# Patient Record
Sex: Female | Born: 1985 | Race: Black or African American | Hispanic: No | Marital: Single | Smoking: Never smoker
Health system: Southern US, Community
[De-identification: ages and names within clinical notes are randomized; demographics above are authoritative.]

## PROBLEM LIST (undated history)

## (undated) DIAGNOSIS — I10 Essential (primary) hypertension: Secondary | ICD-10-CM

## (undated) HISTORY — DX: Essential (primary) hypertension: I10

---

## 2017-02-18 ENCOUNTER — Emergency Department: Payer: No Typology Code available for payment source

## 2017-02-18 ENCOUNTER — Emergency Department
Admission: EM | Admit: 2017-02-18 | Discharge: 2017-02-18 | Disposition: A | Discharge status: Home / Self Care | Payer: No Typology Code available for payment source | Attending: Student in an Organized Health Care Education/Training Program | Admitting: Student in an Organized Health Care Education/Training Program

## 2017-02-18 ENCOUNTER — Other Ambulatory Visit: Payer: Self-pay

## 2017-02-18 DIAGNOSIS — Y999 Unspecified external cause status: Secondary | ICD-10-CM | POA: Diagnosis not present

## 2017-02-18 DIAGNOSIS — Y929 Unspecified place or not applicable: Secondary | ICD-10-CM | POA: Insufficient documentation

## 2017-02-18 DIAGNOSIS — S1081XA Abrasion of other specified part of neck, initial encounter: Secondary | ICD-10-CM | POA: Diagnosis not present

## 2017-02-18 DIAGNOSIS — R0789 Other chest pain: Secondary | ICD-10-CM | POA: Diagnosis not present

## 2017-02-18 DIAGNOSIS — Y939 Activity, unspecified: Secondary | ICD-10-CM | POA: Insufficient documentation

## 2017-02-18 DIAGNOSIS — S1091XA Abrasion of unspecified part of neck, initial encounter: Secondary | ICD-10-CM

## 2017-02-18 DIAGNOSIS — S199XXA Unspecified injury of neck, initial encounter: Secondary | ICD-10-CM | POA: Diagnosis present

## 2017-02-18 MED ORDER — IBUPROFEN 800 MG PO TABS
800.0000 mg | ORAL_TABLET | Freq: Once | ORAL | Status: AC
  Administered 2017-02-18: 800 mg via ORAL
  Filled 2017-02-18: qty 1

## 2017-02-18 MED ORDER — IBUPROFEN 600 MG PO TABS: 600.0000 mg | ORAL_TABLET | Freq: Three times a day (TID) | ORAL | 0 refills | Status: DC | PRN

## 2017-02-18 MED ORDER — METHOCARBAMOL 500 MG PO TABS
1000.0000 mg | ORAL_TABLET | Freq: Once | ORAL | Status: AC
  Administered 2017-02-18: 1000 mg via ORAL
  Filled 2017-02-18: qty 2

## 2017-02-18 MED ORDER — METHOCARBAMOL 500 MG PO TABS: ORAL_TABLET | ORAL | 0 refills | Status: DC
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
## 2017-02-18 NOTE — Discharge Instructions (Signed)
Follow-up with your primary care doctor if any continued problems. Watch the abrasion to your  neck and cleaned daily with mild soap and water. See your doctor if any signs of infection. Begin taking ibuprofen 600 mg every 8 hours with food for inflammation and pain. Robaxin 1-2 tablets every 6 hours as needed for muscle spasms. You may use ice or heat to muscles as needed for discomfort. You  can expect to be sore for possibly 4-5 days.
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
## 2018-08-14 ENCOUNTER — Encounter: Payer: Self-pay | Admitting: Obstetrics and Gynecology

## 2018-08-23 ENCOUNTER — Telehealth: Payer: Self-pay | Admitting: Obstetrics and Gynecology

## 2018-08-23 NOTE — Telephone Encounter (Signed)

## 2018-08-27 ENCOUNTER — Other Ambulatory Visit (HOSPITAL_COMMUNITY)
Admission: RE | Admit: 2018-08-27 | Discharge: 2018-08-27 | Disposition: A | Discharge status: Home / Self Care | Payer: BC Managed Care – PPO | Source: Ambulatory Visit | Attending: Obstetrics and Gynecology | Admitting: Obstetrics and Gynecology

## 2018-08-27 ENCOUNTER — Encounter: Payer: Self-pay | Admitting: Obstetrics and Gynecology

## 2018-08-27 ENCOUNTER — Ambulatory Visit (INDEPENDENT_AMBULATORY_CARE_PROVIDER_SITE_OTHER): Payer: BC Managed Care – PPO | Admitting: Obstetrics and Gynecology

## 2018-08-27 ENCOUNTER — Other Ambulatory Visit: Payer: Self-pay

## 2018-08-27 VITALS — BP 158/106 | HR 91 | Ht 67.0 in | Wt 287.2 lb

## 2018-08-27 DIAGNOSIS — A5901 Trichomonal vulvovaginitis: Secondary | ICD-10-CM | POA: Diagnosis not present

## 2018-08-27 DIAGNOSIS — Z01419 Encounter for gynecological examination (general) (routine) without abnormal findings: Secondary | ICD-10-CM | POA: Insufficient documentation

## 2018-08-27 LAB — POCT WET PREP (WET MOUNT): WBC, Wet Prep HPF POC: POSITIVE

## 2018-08-27 MED ORDER — METRONIDAZOLE 500 MG PO TABS: 500.0000 mg | ORAL_TABLET | Freq: Two times a day (BID) | ORAL | 1 refills | Status: AC
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
## 2018-08-27 NOTE — Addendum Note (Signed)
Addended by: Linton Rump on: 08/27/2018 12:18 PM   Modules accepted: Orders

## 2018-08-29 ENCOUNTER — Telehealth: Payer: Self-pay | Admitting: Obstetrics and Gynecology

## 2018-08-29 LAB — CYTOLOGY - PAP
Chlamydia: NEGATIVE
Diagnosis: NEGATIVE
HPV: NOT DETECTED
Neisseria Gonorrhea: NEGATIVE

## 2018-08-29 NOTE — Telephone Encounter (Signed)
Phone call cancelled as pt has tx for trich at time of pap collection

## 2018-09-26 ENCOUNTER — Telehealth: Payer: Self-pay | Admitting: Obstetrics and Gynecology

## 2018-09-26 NOTE — Telephone Encounter (Signed)

## 2018-09-27 ENCOUNTER — Ambulatory Visit: Payer: BC Managed Care – PPO | Admitting: Obstetrics and Gynecology
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
## 2021-08-09 DIAGNOSIS — I169 Hypertensive crisis, unspecified: Secondary | ICD-10-CM | POA: Diagnosis not present

## 2021-08-09 DIAGNOSIS — Z6841 Body Mass Index (BMI) 40.0 and over, adult: Secondary | ICD-10-CM | POA: Diagnosis not present

## 2021-08-09 DIAGNOSIS — I10 Essential (primary) hypertension: Secondary | ICD-10-CM | POA: Diagnosis not present

## 2021-08-25 DIAGNOSIS — I10 Essential (primary) hypertension: Secondary | ICD-10-CM | POA: Diagnosis not present

## 2021-08-25 DIAGNOSIS — I169 Hypertensive crisis, unspecified: Secondary | ICD-10-CM | POA: Diagnosis not present

## 2021-08-25 DIAGNOSIS — Z79899 Other long term (current) drug therapy: Secondary | ICD-10-CM | POA: Diagnosis not present

## 2021-08-25 DIAGNOSIS — Z6841 Body Mass Index (BMI) 40.0 and over, adult: Secondary | ICD-10-CM | POA: Diagnosis not present

## 2021-12-05 ENCOUNTER — Ambulatory Visit: Payer: BC Managed Care – PPO | Admitting: Obstetrics & Gynecology

## 2022-01-17 ENCOUNTER — Encounter: Payer: Self-pay | Admitting: Obstetrics & Gynecology

## 2022-01-17 ENCOUNTER — Other Ambulatory Visit (HOSPITAL_COMMUNITY)
Admission: RE | Admit: 2022-01-17 | Discharge: 2022-01-17 | Disposition: A | Discharge status: Home / Self Care | Payer: BC Managed Care – PPO | Source: Ambulatory Visit | Attending: Obstetrics & Gynecology | Admitting: Obstetrics & Gynecology

## 2022-01-17 ENCOUNTER — Ambulatory Visit (INDEPENDENT_AMBULATORY_CARE_PROVIDER_SITE_OTHER): Payer: BC Managed Care – PPO | Admitting: Obstetrics & Gynecology

## 2022-01-17 VITALS — BP 179/115 | HR 93 | Ht 67.0 in | Wt 311.2 lb

## 2022-01-17 DIAGNOSIS — Z01419 Encounter for gynecological examination (general) (routine) without abnormal findings: Secondary | ICD-10-CM

## 2022-01-17 NOTE — Progress Notes (Signed)
Subjective:     Sherri Griffin is a 36 y.o. female here for a routine exam.  Patient's last menstrual period was 12/05/2021. G1P1001 Birth Control Method:  mirena IUD Menstrual Calendar(currently): irregular  Current complaints: none.   Current acute medical issues:  none   Recent Gynecologic History Patient's last menstrual period was 12/05/2021. Last Pap: 2020,  normal Last mammogram: na,    Past Medical History:  Diagnosis Date   Hypertension     History reviewed. No pertinent surgical history.  OB History     Gravida  1   Para  1   Term  1   Preterm      AB      Living  1      SAB      IAB      Ectopic      Multiple      Live Births  1           Social History   Socioeconomic History   Marital status: Single    Spouse name: Not on file   Number of children: Not on file   Years of education: Not on file   Highest education level: Not on file  Occupational History   Not on file  Tobacco Use   Smoking status: Never   Smokeless tobacco: Never  Vaping Use   Vaping Use: Never used  Substance and Sexual Activity   Alcohol use: Yes    Comment: wine daily   Drug use: Never   Sexual activity: Yes    Birth control/protection: I.U.D.  Other Topics Concern   Not on file  Social History Narrative   Not on file   Social Determinants of Health   Financial Resource Strain: Medium Risk (01/17/2022)   Overall Financial Resource Strain (CARDIA)    Difficulty of Paying Living Expenses: Somewhat hard  Food Insecurity: Food Insecurity Present (01/17/2022)   Hunger Vital Sign    Worried About Running Out of Food in the Last Year: Sometimes true    Ran Out of Food in the Last Year: Sometimes true  Transportation Needs: No Transportation Needs (01/17/2022)   PRAPARE - Administrator, Civil Service (Medical): No    Lack of Transportation (Non-Medical): No  Physical Activity: Insufficiently Active (01/17/2022)   Exercise Vital Sign    Days  of Exercise per Week: 2 days    Minutes of Exercise per Session: 10 min  Stress: No Stress Concern Present (01/17/2022)   [employer] of Occupational Health - Occupational Stress Questionnaire    Feeling of Stress : Only a little  Social Connections: Moderately Integrated (01/17/2022)   Social Connection and Isolation Panel [NHANES]    Frequency of Communication with Friends and Family: More than three times a week    Frequency of Social Gatherings with Friends and Family: More than three times a week    Attends Religious Services: 1 to 4 times per year    Active Member of [employer] or Organizations: Yes    Attends Banker Meetings: 1 to 4 times per year    Marital Status: Never married    Family History  Problem Relation Age of Onset   Diabetes Paternal Grandfather    Hypertension Paternal Grandmother    High Cholesterol Paternal Grandmother    Hypertension Maternal Grandmother    High Cholesterol Maternal Grandmother    Hypertension Maternal Grandfather    High Cholesterol Maternal Grandfather    Hypertension Father  High Cholesterol Father    Hypertension Mother    High Cholesterol Mother      Current Outpatient Medications:    amLODipine (NORVASC) 10 MG tablet, Take 10 mg by mouth daily. , Disp: , Rfl:    hydrochlorothiazide (HYDRODIURIL) 25 MG tablet, Take 25 mg by mouth daily. , Disp: , Rfl:    ibuprofen (ADVIL,MOTRIN) 600 MG tablet, Take 1 tablet (600 mg total) by mouth every 8 (eight) hours as needed., Disp: 30 tablet, Rfl: 0   levonorgestrel (LILETTA, 52 MG,) 19.5 MCG/DAY IUD IUD, 1 each by Intrauterine route once., Disp: , Rfl:    clindamycin (CLEOCIN) 300 MG capsule, 300 mg 3 (three) times daily.  (Patient not taking: Reported on 01/17/2022), Disp: , Rfl:   Review of Systems  Review of Systems  Constitutional: Negative for fever, chills, weight loss, malaise/fatigue and diaphoresis.  HENT: Negative for hearing loss, ear pain, nosebleeds,  congestion, sore throat, neck pain, tinnitus and ear discharge.   Eyes: Negative for blurred vision, double vision, photophobia, pain, discharge and redness.  Respiratory: Negative for cough, hemoptysis, sputum production, shortness of breath, wheezing and stridor.   Cardiovascular: Negative for chest pain, palpitations, orthopnea, claudication, leg swelling and PND.  Gastrointestinal: negative for abdominal pain. Negative for heartburn, nausea, vomiting, diarrhea, constipation, blood in stool and melena.  Genitourinary: Negative for dysuria, urgency, frequency, hematuria and flank pain.  Musculoskeletal: Negative for myalgias, back pain, joint pain and falls.  Skin: Negative for itching and rash.  Neurological: Negative for dizziness, tingling, tremors, sensory change, speech change, focal weakness, seizures, loss of consciousness, weakness and headaches.  Endo/Heme/Allergies: Negative for environmental allergies and polydipsia. Does not bruise/bleed easily.  Psychiatric/Behavioral: Negative for depression, suicidal ideas, hallucinations, memory loss and substance abuse. The patient is not nervous/anxious and does not have insomnia.        Objective:  Height 5\' 7"  (1.702 m), weight (!) 311 lb 3.2 oz (141.2 kg), last menstrual period 12/05/2021.   Physical Exam  Vitals reviewed. Constitutional: She is oriented to person, place, and time. She appears well-developed and well-nourished.  HENT:  Head: Normocephalic and atraumatic.        Right Ear: External ear normal.  Left Ear: External ear normal.  Nose: Nose normal.  Mouth/Throat: Oropharynx is clear and moist.  Eyes: Conjunctivae and EOM are normal. Pupils are equal, round, and reactive to light. Right eye exhibits no discharge. Left eye exhibits no discharge. No scleral icterus.  Neck: Normal range of motion. Neck supple. No tracheal deviation present. No thyromegaly present.  Cardiovascular: Normal rate, regular rhythm, normal heart  sounds and intact distal pulses.  Exam reveals no gallop and no friction rub.   No murmur heard. Respiratory: Effort normal and breath sounds normal. No respiratory distress. She has no wheezes. She has no rales. She exhibits no tenderness.  GI: Soft. Bowel sounds are normal. She exhibits no distension and no mass. There is no tenderness. There is no rebound and no guarding.  Genitourinary:  Breasts no masses skin changes or nipple changes bilaterally      Vulva is normal without lesions Vagina is pink moist without discharge Cervix normal in appearance and pap is done, strings visible Uterus is normal size shape and contour Adnexa is negative with normal sized ovaries   Musculoskeletal: Normal range of motion. She exhibits no edema and no tenderness.  Neurological: She is alert and oriented to person, place, and time. She has normal reflexes. She displays normal reflexes. No cranial  nerve deficit. She exhibits normal muscle tone. Coordination normal.  Skin: Skin is warm and dry. No rash noted. No erythema. No pallor.  Psychiatric: She has a normal mood and affect. Her behavior is normal. Judgment and thought content normal.       Medications Ordered at today's visit: No orders of the defined types were placed in this encounter.   Other orders placed at today's visit: No orders of the defined types were placed in this encounter.     Assessment:    Normal Gyn exam.    Plan:   Contraception IUD Repeat yearly 3 years     Return in about 3 years (around 01/17/2025) for yearly.

## 2022-01-23 LAB — CYTOLOGY - PAP
Comment: NEGATIVE
Diagnosis: UNDETERMINED — AB
High risk HPV: NEGATIVE

## 2022-04-05 DIAGNOSIS — I10 Essential (primary) hypertension: Secondary | ICD-10-CM | POA: Diagnosis not present

## 2022-04-05 DIAGNOSIS — F4323 Adjustment disorder with mixed anxiety and depressed mood: Secondary | ICD-10-CM | POA: Diagnosis not present

## 2022-10-19 DIAGNOSIS — Z713 Dietary counseling and surveillance: Secondary | ICD-10-CM | POA: Diagnosis not present

## 2022-10-19 DIAGNOSIS — Z6841 Body Mass Index (BMI) 40.0 and over, adult: Secondary | ICD-10-CM | POA: Diagnosis not present

## 2022-10-19 DIAGNOSIS — I169 Hypertensive crisis, unspecified: Secondary | ICD-10-CM | POA: Diagnosis not present

## 2022-10-19 DIAGNOSIS — Z91199 Patient's noncompliance with other medical treatment and regimen due to unspecified reason: Secondary | ICD-10-CM | POA: Diagnosis not present

## 2022-10-19 DIAGNOSIS — I10 Essential (primary) hypertension: Secondary | ICD-10-CM | POA: Diagnosis not present

## 2022-10-19 DIAGNOSIS — Z7182 Exercise counseling: Secondary | ICD-10-CM | POA: Diagnosis not present

## 2022-10-19 DIAGNOSIS — Z79899 Other long term (current) drug therapy: Secondary | ICD-10-CM | POA: Diagnosis not present

## 2022-11-07 DIAGNOSIS — Z713 Dietary counseling and surveillance: Secondary | ICD-10-CM | POA: Diagnosis not present

## 2022-11-07 DIAGNOSIS — I10 Essential (primary) hypertension: Secondary | ICD-10-CM | POA: Diagnosis not present

## 2022-11-15 DIAGNOSIS — N6320 Unspecified lump in the left breast, unspecified quadrant: Secondary | ICD-10-CM | POA: Diagnosis not present

## 2022-11-15 DIAGNOSIS — R92323 Mammographic fibroglandular density, bilateral breasts: Secondary | ICD-10-CM | POA: Diagnosis not present

## 2022-12-05 ENCOUNTER — Encounter: Payer: Self-pay | Admitting: Internal Medicine

## 2022-12-05 ENCOUNTER — Telehealth: Payer: Self-pay

## 2022-12-05 ENCOUNTER — Telehealth: Payer: Self-pay | Admitting: Internal Medicine

## 2022-12-05 ENCOUNTER — Ambulatory Visit: Payer: BC Managed Care – PPO | Attending: Internal Medicine | Admitting: Internal Medicine

## 2022-12-05 VITALS — BP 160/100 | HR 92 | Ht 66.0 in | Wt 303.0 lb

## 2022-12-05 DIAGNOSIS — G4733 Obstructive sleep apnea (adult) (pediatric): Secondary | ICD-10-CM

## 2022-12-05 DIAGNOSIS — R634 Abnormal weight loss: Secondary | ICD-10-CM

## 2022-12-05 DIAGNOSIS — I10 Essential (primary) hypertension: Secondary | ICD-10-CM | POA: Diagnosis not present

## 2022-12-05 DIAGNOSIS — I1A0 Resistant hypertension: Secondary | ICD-10-CM

## 2022-12-05 DIAGNOSIS — Z79899 Other long term (current) drug therapy: Secondary | ICD-10-CM

## 2022-12-05 MED ORDER — CARVEDILOL 6.25 MG PO TABS: 6.2500 mg | ORAL_TABLET | Freq: Two times a day (BID) | ORAL | 2 refills | Status: DC

## 2022-12-05 MED ORDER — ENTRESTO 49-51 MG PO TABS: 1.0000 | ORAL_TABLET | Freq: Two times a day (BID) | ORAL | 2 refills | Status: DC

## 2022-12-05 NOTE — Telephone Encounter (Signed)
Patient needs to have Pre-cert for sleep study

## 2022-12-05 NOTE — Patient Instructions (Addendum)
Medication Instructions:  Your physician has recommended you make the following change in your medication:  Stop taking Lisinopril  Start taking Coreg 6.25 twice daily Start taking Entresto 49-51 twice daily starting on Friday Continue taking all other medications as prescribed  Labwork: BMET in a week at [facility] on 12/12/2022  Testing/Procedures: Your physician has requested that you have an echocardiogram. Echocardiography is a painless test that uses sound waves to create images of your heart. It provides your doctor with information about the size and shape of your heart and how well your heart's chambers and valves are working. This procedure takes approximately one hour. There are no restrictions for this procedure. Please do NOT wear cologne, perfume, aftershave, or lotions (deodorant is allowed). Please arrive 15 minutes prior to your appointment time.  Your physician has requested that you have a renal artery duplex. During this test, an ultrasound is used to evaluate blood flow to the kidneys. Allow one hour for this exam. Do not eat after midnight the day before and avoid carbonated beverages. Take your medications as you usually do.  Your physician has recommended that you have a sleep study. This test records several body functions during sleep, including: brain activity, eye movement, oxygen and carbon dioxide blood levels, heart rate and rhythm, breathing rate and rhythm, the flow of air through your mouth and nose, snoring, body muscle movements, and chest and belly movement.   Follow-Up: Your physician recommends that you schedule a follow-up appointment in: 1 month follow up Telephone visit  Any Other Special Instructions Will Be Listed Below (If Applicable).   If you need a refill on your cardiac medications before your next appointment, please call your pharmacy.

## 2022-12-05 NOTE — Telephone Encounter (Signed)
  Patient Consent for Virtual Visit        Sherri Griffin has provided verbal consent on 12/05/2022 for a virtual visit (video or telephone).   CONSENT FOR VIRTUAL VISIT FOR:  Sherri Griffin  By participating in this virtual visit I agree to the following:  I hereby voluntarily request, consent and authorize [facility] and its employed or contracted physicians, physician assistants, nurse practitioners or other licensed health care professionals (the Practitioner), to provide me with telemedicine health care services (the "Services") as deemed necessary by the treating Practitioner. I acknowledge and consent to receive the Services by the Practitioner via telemedicine. I understand that the telemedicine visit will involve communicating with the Practitioner through live audiovisual communication technology and the disclosure of certain medical information by electronic transmission. I acknowledge that I have been given the opportunity to request an in-person assessment or other available alternative prior to the telemedicine visit and am voluntarily participating in the telemedicine visit.  I understand that I have the right to withhold or withdraw my consent to the use of telemedicine in the course of my care at any time, without affecting my right to future care or treatment, and that the Practitioner or I may terminate the telemedicine visit at any time. I understand that I have the right to inspect all information obtained and/or recorded in the course of the telemedicine visit and may receive copies of available information for a reasonable fee.  I understand that some of the potential risks of receiving the Services via telemedicine include:  Delay or interruption in medical evaluation due to technological equipment failure or disruption; Information transmitted may not be sufficient (e.g. poor resolution of images) to allow for appropriate medical decision making by the Practitioner;  and/or  In rare instances, security protocols could fail, causing a breach of personal health information.  Furthermore, I acknowledge that it is my responsibility to provide information about my medical history, conditions and care that is complete and accurate to the best of my ability. I acknowledge that Practitioner's advice, recommendations, and/or decision may be based on factors not within their control, such as incomplete or inaccurate data provided by me or distortions of diagnostic images or specimens that may result from electronic transmissions. I understand that the practice of medicine is not an exact science and that Practitioner makes no warranties or guarantees regarding treatment outcomes. I acknowledge that a copy of this consent can be made available to me via my patient portal [facility] MyChart), or I can request a printed copy by calling the office of [facility].    I understand that my insurance will be billed for this visit.   I have read or had this consent read to me. I understand the contents of this consent, which adequately explains the benefits and risks of the Services being provided via telemedicine.  I have been provided ample opportunity to ask questions regarding this consent and the Services and have had my questions answered to my satisfaction. I give my informed consent for the services to be provided through the use of telemedicine in my medical care

## 2022-12-05 NOTE — Progress Notes (Signed)
Cardiology Office Note  Date: 12/05/2022   ID: Sherri Griffin, DOB December 17, 1985, MRN [id number]  PCP:  Wilmon Pali, FNP  Cardiologist:  Marjo Bicker, MD Electrophysiologist:  None   History of Present Illness: Sherri Griffin is a 37 y.o. female known to have poorly controlled HTN was referred to cardiology clinic for management of HTN.  Patient's blood pressures have been chronically elevated since delivery of her care which was 10 years ago.  She is currently on 3 antihypertensives with inadequate control of BPs.  Home BPs range between 150 and 170 mmHg SBP.  Compliant with medications and has no side effects.  She was never tested for OSA but was told she snores a lot.  She does have DOE because she thinks she is obese.  No angina, dizziness, presyncope, syncope, palpitations.  No orthopnea, PND.  No leg swelling.  Past Medical History:  Diagnosis Date   Hypertension     No past surgical history on file.  Current Outpatient Medications  Medication Sig Dispense Refill   amLODipine (NORVASC) 10 MG tablet Take 10 mg by mouth daily.      carvedilol (COREG) 6.25 MG tablet Take 1 tablet (6.25 mg total) by mouth 2 (two) times daily with a meal. 60 tablet 2   hydrochlorothiazide (HYDRODIURIL) 25 MG tablet Take 25 mg by mouth daily.      levonorgestrel (LILETTA, 52 MG,) 19.5 MCG/DAY IUD IUD 1 each by Intrauterine route once.     sacubitril-valsartan (ENTRESTO) 49-51 MG Take 1 tablet by mouth 2 (two) times daily. 60 tablet 2   No current facility-administered medications for this visit.   Allergies:  Hydrocodone   Social History: The patient  reports that she has never smoked. She has never used smokeless tobacco. She reports current alcohol use. She reports that she does not use drugs.   Family History: The patient's family history includes Diabetes in her paternal grandfather; High Cholesterol in her father, maternal grandfather, maternal grandmother, mother, and paternal  grandmother; Hypertension in her father, maternal grandfather, maternal grandmother, mother, and paternal grandmother.   ROS:  Please see the history of present illness. Otherwise, complete review of systems is positive for none  All other systems are reviewed and negative.   Physical Exam: VS:  BP (!) 160/100 (BP Location: Right Arm, Cuff Size: Large)   Pulse 92   Ht 5\' 6"  (1.676 m)   Wt (!) 303 lb (137.4 kg)   SpO2 97%   BMI 48.91 kg/m , BMI Body mass index is 48.91 kg/m.  Wt Readings from Last 3 Encounters:  12/05/22 (!) 303 lb (137.4 kg)  01/17/22 (!) 311 lb 3.2 oz (141.2 kg)  08/27/18 287 lb 3.2 oz (130.3 kg)    General: Patient appears comfortable at rest. HEENT: Conjunctiva and lids normal, oropharynx clear with moist mucosa. Neck: Supple, no elevated JVP or carotid bruits, no thyromegaly. Lungs: Clear to auscultation, nonlabored breathing at rest. Cardiac: Regular rate and rhythm, no S3 or significant systolic murmur, no pericardial rub. Abdomen: Soft, nontender, no hepatomegaly, bowel sounds present, no guarding or rebound. Extremities: No pitting edema, distal pulses 2+. Skin: Warm and dry. Musculoskeletal: No kyphosis. Neuropsychiatric: Alert and oriented x3, affect grossly appropriate.  Recent Labwork: No results found for requested labs within last 365 days.  No results found for: "CHOL", "TRIG", "HDL", "CHOLHDL", "VLDL", "LDLCALC", "LDLDIRECT"   Assessment and Plan:  Resistant HTN Morbid obesity with BMI 48.91   -Continue HCTZ 25 mg once  daily, amlodipine 10 mg once daily may, switch lisinopril to Entresto 49-51 mg twice daily (instructed 48-hour washout Of lisinopril prior to starting Entresto), start carvedilol 6.25 mg twice daily.  Obtain BMP in 5 days.  Obtain home sleep study for OSA evaluation, ultrasound renal artery Doppler to rule out renal artery stenosis and to do echocardiogram.  Diet and exercise counseling provided.  Referral to weight loss  program.       Medication Adjustments/Labs and Tests Ordered: Current medicines are reviewed at length with the patient today.  Concerns regarding medicines are outlined above.    Disposition:  Follow up  1 month via telephone visit  Signed Summerlyn Fickel Verne Spurr, MD, 12/05/2022 4:37 PM    [facility] at [facility] [address] [place], [place], [place] [address]

## 2022-12-12 NOTE — Telephone Encounter (Signed)
Prior Authorization for [facility] sent to [employer] via web portal. Tracking Number . READY- NO PA REQ FOR HST   Ordering provider: [name] Associated diagnoses: G47.33 WatchPAT PA obtained on 12/12/2022 by Latrelle Dodrill, CMA. Authorization: No; tracking ID   Patient notified of PIN (1234) on 12/12/2022 via Notification Method: phone.

## 2022-12-12 NOTE — Addendum Note (Signed)
Addended by: Reesa Chew on: 12/12/2022 06:10 PM   Modules accepted: Orders

## 2022-12-13 NOTE — Telephone Encounter (Signed)
Spoke with patient stated that she will be completing sleep study tonight. Patient aware of pin and instructions

## 2022-12-17 ENCOUNTER — Encounter: Payer: Self-pay | Admitting: Cardiology

## 2022-12-18 ENCOUNTER — Ambulatory Visit (HOSPITAL_COMMUNITY)
Admission: RE | Admit: 2022-12-18 | Discharge: 2022-12-18 | Disposition: A | Discharge status: Home / Self Care | Payer: BC Managed Care – PPO | Source: Ambulatory Visit | Attending: Cardiology | Admitting: Cardiology

## 2022-12-18 DIAGNOSIS — I10 Essential (primary) hypertension: Secondary | ICD-10-CM | POA: Diagnosis not present

## 2022-12-19 ENCOUNTER — Ambulatory Visit: Payer: BC Managed Care – PPO | Attending: Internal Medicine

## 2022-12-19 DIAGNOSIS — I10 Essential (primary) hypertension: Secondary | ICD-10-CM

## 2022-12-19 LAB — ECHOCARDIOGRAM COMPLETE
AR max vel: 4.16 cm2
AV Area VTI: 4.25 cm2
AV Area mean vel: 4.81 cm2
AV Mean grad: 4 mm[Hg]
AV Peak grad: 8.4 mm[Hg]
Ao pk vel: 1.45 m/s
Area-P 1/2: 2.73 cm2
Calc EF: 60.1 %
MV VTI: 5.6 cm2
S' Lateral: 2.5 cm
Single Plane A2C EF: 68.2 %
Single Plane A4C EF: 52.1 %

## 2022-12-22 ENCOUNTER — Encounter: Payer: Self-pay | Admitting: *Deleted

## 2022-12-31 ENCOUNTER — Ambulatory Visit: Payer: BC Managed Care – PPO | Attending: Internal Medicine

## 2022-12-31 DIAGNOSIS — I1A0 Resistant hypertension: Secondary | ICD-10-CM

## 2022-12-31 DIAGNOSIS — G4733 Obstructive sleep apnea (adult) (pediatric): Secondary | ICD-10-CM

## 2022-12-31 NOTE — Progress Notes (Unsigned)
This encounter was created in error - please disregard.

## 2022-12-31 NOTE — Procedures (Unsigned)
Erroneous encounter

## 2023-01-16 ENCOUNTER — Ambulatory Visit: Payer: BC Managed Care – PPO | Admitting: Nutrition

## 2023-01-19 ENCOUNTER — Ambulatory Visit: Payer: BC Managed Care – PPO | Attending: Internal Medicine | Admitting: Internal Medicine

## 2023-01-19 ENCOUNTER — Encounter: Payer: Self-pay | Admitting: Internal Medicine

## 2023-01-19 DIAGNOSIS — I10 Essential (primary) hypertension: Secondary | ICD-10-CM

## 2023-01-19 MED ORDER — CARVEDILOL 12.5 MG PO TABS: 12.5000 mg | ORAL_TABLET | Freq: Two times a day (BID) | ORAL | 2 refills | Status: DC

## 2023-01-19 NOTE — Progress Notes (Signed)
Virtual Visit via Telephone Note   Because of Sherri Griffin's co-morbid illnesses, she is at least at moderate risk for complications without adequate follow up.  This format is felt to be most appropriate for this patient at this time.  The patient did not have access to video technology/had technical difficulties with video requiring transitioning to audio format only (telephone).  All issues noted in this document were discussed and addressed.  No physical exam could be performed with this format.  Please refer to the patient's chart for her consent to telehealth for [facility].    Date:  01/19/2023   ID:  Sherri Griffin, DOB Jul 13, 1985, MRN [id number] The patient was identified using 2 identifiers.  Patient Location: Home Provider Location: Office/Clinic   PCP:  Wilmon Pali, FNP   [facility] Providers Cardiologist:  Marjo Bicker, MD     Evaluation Performed:  Follow-Up Visit  Chief Complaint:  HTN  History of Present Illness:    Sherri Griffin is a 37 y.o. female with HTN is here for for a telephone visit.  Previously her BPs were ranging between 150 and 170 mmHg SBP, after medication changes in the last clinic visit, her BPs are better controlled now ranging between 135 and 140 mmHg SBP.  She reported having difficulty with Sherryll Burger, voucher provided.  Does not have any symptoms of angina, DOE, dizziness, syncope, palpitations or leg swelling.   Past Medical History:  Diagnosis Date   Hypertension    No past surgical history on file.   Current Meds  Medication Sig   amLODipine (NORVASC) 10 MG tablet Take 10 mg by mouth daily.    carvedilol (COREG) 12.5 MG tablet Take 1 tablet (12.5 mg total) by mouth 2 (two) times daily.   hydrochlorothiazide (HYDRODIURIL) 25 MG tablet Take 25 mg by mouth daily.    levonorgestrel (LILETTA, 52 MG,) 19.5 MCG/DAY IUD IUD 1 each by Intrauterine route once.   sacubitril-valsartan (ENTRESTO) 49-51 MG Take 1  tablet by mouth 2 (two) times daily.   [DISCONTINUED] carvedilol (COREG) 6.25 MG tablet Take 1 tablet (6.25 mg total) by mouth 2 (two) times daily with a meal.     Allergies:   Hydrocodone   Social History   Tobacco Use   Smoking status: Never   Smokeless tobacco: Never  Vaping Use   Vaping status: Never Used  Substance Use Topics   Alcohol use: Yes    Comment: wine daily   Drug use: Never     Family Hx: The patient's family history includes Diabetes in her paternal grandfather; High Cholesterol in her father, maternal grandfather, maternal grandmother, mother, and paternal grandmother; Hypertension in her father, maternal grandfather, maternal grandmother, mother, and paternal grandmother.  ROS:   Please see the history of present illness.     All other systems reviewed and are negative.   Prior CV studies:   The following studies were reviewed today:    Labs/Other Tests and Data Reviewed:    EKG:    Recent Labs: No results found for requested labs within last 365 days.   Recent Lipid Panel No results found for: "CHOL", "TRIG", "HDL", "CHOLHDL", "LDLCALC", "LDLDIRECT"  Wt Readings from Last 3 Encounters:  01/19/23 290 lb (131.5 kg)  12/05/22 (!) 303 lb (137.4 kg)  01/17/22 (!) 311 lb 3.2 oz (141.2 kg)     Risk Assessment/Calculations:     STOP-Bang Score:  4      Objective:    Vital  Signs:  BP (!) 147/80 Comment: home BP 01/16/2023  Ht 5\' 6"  (1.676 m)   Wt 290 lb (131.5 kg)   BMI 46.81 kg/m      ASSESSMENT & PLAN:    HTN -Continue current antihypertensives, HCTZ 25 mg once daily, amlodipine 10 mg once daily, increase carvedilol from 6.25 to 12.5 mg twice daily, Entresto 49-51 mg twice daily.  Home BPs reviewed, ranging between 135 and 140 mmHg SBP.  Continue heart healthy diet and exercise.  Home sleep study test pending read. Ultrasound renal artery Doppler showed no evidence of renal artery stenosis.   Time:   Today, I have spent 10 minutes  with the patient with telehealth technology discussing the above problems.     Medication Adjustments/Labs and Tests Ordered: Current medicines are reviewed at length with the patient today.  Concerns regarding medicines are outlined above.   Tests Ordered: No orders of the defined types were placed in this encounter.   Medication Changes: Meds ordered this encounter  Medications   carvedilol (COREG) 12.5 MG tablet    Sig: Take 1 tablet (12.5 mg total) by mouth 2 (two) times daily.    Dispense:  180 tablet    Refill:  2    01/19/2023 dose increase    Follow Up:   F/u   6 months  Signed, Jenalee Trevizo P Essence Merle, MD  01/19/2023 2:54 PM    [facility]

## 2023-01-19 NOTE — Patient Instructions (Addendum)
Medication Instructions:  Your physician has recommended you make the following change in your medication:  Increase carvedilol to 12.5 mg twice daily Continue all other medications the same  Labwork: none  Testing/Procedures: none  Follow-Up: Your physician recommends that you schedule a follow-up appointment in: 6 months in person  Any Other Special Instructions Will Be Listed Below (If Applicable).  If you need a refill on your cardiac medications before your next appointment, please call your pharmacy.

## 2023-01-24 ENCOUNTER — Ambulatory Visit: Payer: BC Managed Care – PPO | Admitting: Obstetrics & Gynecology

## 2023-02-12 ENCOUNTER — Telehealth: Payer: Self-pay

## 2023-02-12 DIAGNOSIS — G4733 Obstructive sleep apnea (adult) (pediatric): Secondary | ICD-10-CM

## 2023-02-12 NOTE — Telephone Encounter (Signed)
-----   Message from Armanda Magic sent at 12/31/2022  8:29 PM EDT ----- Needs new sleep study -poor PAT signal.  Recommend in lab split night sleep study instead

## 2023-02-12 NOTE — Telephone Encounter (Signed)
Notified patient of sleep study results and recommendations. In lab split night sleep study ordered today.

## 2023-02-13 ENCOUNTER — Ambulatory Visit: Payer: BC Managed Care – PPO | Admitting: Nutrition

## 2023-02-15 DIAGNOSIS — Z23 Encounter for immunization: Secondary | ICD-10-CM | POA: Diagnosis not present

## 2023-02-15 DIAGNOSIS — Z6841 Body Mass Index (BMI) 40.0 and over, adult: Secondary | ICD-10-CM | POA: Diagnosis not present

## 2023-02-15 DIAGNOSIS — E119 Type 2 diabetes mellitus without complications: Secondary | ICD-10-CM | POA: Diagnosis not present

## 2023-02-15 DIAGNOSIS — Z79899 Other long term (current) drug therapy: Secondary | ICD-10-CM | POA: Diagnosis not present

## 2023-02-15 DIAGNOSIS — I10 Essential (primary) hypertension: Secondary | ICD-10-CM | POA: Diagnosis not present

## 2023-02-15 DIAGNOSIS — I509 Heart failure, unspecified: Secondary | ICD-10-CM | POA: Diagnosis not present

## 2023-02-16 ENCOUNTER — Ambulatory Visit: Payer: BC Managed Care – PPO | Admitting: Obstetrics & Gynecology

## 2023-03-04 ENCOUNTER — Other Ambulatory Visit: Payer: Self-pay | Admitting: Internal Medicine

## 2023-04-24 DIAGNOSIS — J101 Influenza due to other identified influenza virus with other respiratory manifestations: Secondary | ICD-10-CM | POA: Diagnosis not present

## 2023-05-04 ENCOUNTER — Other Ambulatory Visit: Payer: Self-pay | Admitting: Internal Medicine

## 2023-05-10 ENCOUNTER — Other Ambulatory Visit: Payer: Self-pay | Admitting: Internal Medicine

## 2023-06-21 ENCOUNTER — Telehealth: Payer: Self-pay | Admitting: Pharmacy Technician

## 2023-06-21 ENCOUNTER — Other Ambulatory Visit (HOSPITAL_COMMUNITY): Payer: Self-pay

## 2023-06-21 NOTE — Telephone Encounter (Signed)
 Pharmacy Patient Advocate Encounter   Received notification from Fax that prior authorization for entresto is required/requested.   Insurance verification completed.   The patient is insured through [facility] ADVANTAGE/RX ADVANCE .   Per test claim: PA required; PA submitted to above mentioned insurance via CoverMyMeds Key/confirmation #/EOC [id number] Status is pending

## 2023-06-21 NOTE — Telephone Encounter (Signed)
 PA cancelled- plan reached out to let us  know that PA is not needed.

## 2023-07-26 ENCOUNTER — Other Ambulatory Visit: Payer: Self-pay | Admitting: Internal Medicine

## 2023-10-11 DIAGNOSIS — R21 Rash and other nonspecific skin eruption: Secondary | ICD-10-CM | POA: Diagnosis not present

## 2023-10-11 DIAGNOSIS — L259 Unspecified contact dermatitis, unspecified cause: Secondary | ICD-10-CM | POA: Diagnosis not present

## 2023-10-21 ENCOUNTER — Other Ambulatory Visit: Payer: Self-pay | Admitting: Internal Medicine

## 2023-11-06 DIAGNOSIS — L42 Pityriasis rosea: Secondary | ICD-10-CM | POA: Diagnosis not present

## 2023-11-06 DIAGNOSIS — E119 Type 2 diabetes mellitus without complications: Secondary | ICD-10-CM | POA: Diagnosis not present

## 2023-11-06 DIAGNOSIS — I10 Essential (primary) hypertension: Secondary | ICD-10-CM | POA: Diagnosis not present

## 2023-11-06 DIAGNOSIS — F321 Major depressive disorder, single episode, moderate: Secondary | ICD-10-CM | POA: Diagnosis not present

## 2024-01-28 ENCOUNTER — Telehealth: Payer: Self-pay

## 2024-01-28 DIAGNOSIS — G4733 Obstructive sleep apnea (adult) (pediatric): Secondary | ICD-10-CM

## 2024-01-28 DIAGNOSIS — R634 Abnormal weight loss: Secondary | ICD-10-CM

## 2024-01-28 DIAGNOSIS — I1A0 Resistant hypertension: Secondary | ICD-10-CM

## 2024-01-28 NOTE — Telephone Encounter (Signed)
**Note De-Identified [employer] Obfuscation** I started a Split Night Sleep Study PA through the [employer] Provider Portal. Auth # [facility] [place] [facility] [place] - [id number]

## 2024-02-05 NOTE — Telephone Encounter (Signed)
**Note De-Identified Fitzhugh Vizcarrondo Obfuscation** I checked the [employer] Provider Portal and found that this Split Night Sleep Study PA is still pending.  I then called [employer] and s/w Nino E who advised me that this Split Night Sleep Study PA has been approved from 03/11/2023-09-07-2024. Auth #: [id number]  Call reference #: [id number]  I have transferred the order to the Sleep Lab.  I called the pt but got no answer so I left a message advising the pt that I have sent her a [facility] message and that if she has any questions or concerns after reading it to call Macario back at [facility] at [phone].

## 2024-03-13 ENCOUNTER — Ambulatory Visit: Attending: Internal Medicine | Admitting: Internal Medicine

## 2024-03-13 ENCOUNTER — Encounter: Payer: Self-pay | Admitting: Internal Medicine

## 2024-03-13 VITALS — BP 140/90 | Ht 66.0 in | Wt 312.8 lb

## 2024-03-13 DIAGNOSIS — I10 Essential (primary) hypertension: Secondary | ICD-10-CM | POA: Diagnosis not present

## 2024-03-13 DIAGNOSIS — I1A0 Resistant hypertension: Secondary | ICD-10-CM

## 2024-03-13 MED ORDER — SACUBITRIL-VALSARTAN 97-103 MG PO TABS: 1.0000 | ORAL_TABLET | Freq: Two times a day (BID) | ORAL | 3 refills | Status: AC

## 2024-03-13 NOTE — Patient Instructions (Signed)
 Medication Instructions:  Your physician has recommended you make the following change in your medication:  Complete current dose of Entresto  49-51 twice daily Increase Entresto  to 97-103 twice daily Continue taking all other medications as prescribed  Labwork: None  Testing/Procedures: None  Follow-Up: Your physician recommends that you schedule a follow-up appointment in: 6 months  Any Other Special Instructions Will Be Listed Below (If Applicable). Thank you for choosing [facility]!     If you need a refill on your cardiac medications before your next appointment, please call your pharmacy.

## 2024-03-13 NOTE — Progress Notes (Signed)
 "    Cardiology Office Note  Date: 03/13/2024   ID: Sherri Griffin, DOB Nov 15, 1985, MRN [id number]  PCP:  Gerome Tillman CROME, FNP  Cardiologist:  Diannah SHAUNNA Maywood, MD Electrophysiologist:  None   History of Present Illness: Sherri Griffin is a 39 y.o. female known to have HTN is here today for follow-up visit.  No evidence of RAS.  Home sleep study was inconclusive.  She is scheduled to undergo split-night sleep study on 03/27/2024.  Home to pressures range around 130 to 136 mm Hg.  No symptoms.  Past Medical History:  Diagnosis Date   Hypertension     No past surgical history on file.  Current Outpatient Medications  Medication Sig Dispense Refill   amLODipine (NORVASC) 10 MG tablet Take 10 mg by mouth daily.      carvedilol  (COREG ) 12.5 MG tablet TAKE ONE TABLET BY MOUTH TWICE DAILY 180 tablet 2   hydrochlorothiazide (HYDRODIURIL) 25 MG tablet TAKE ONE TABLET BY MOUTH EVERY DAY 90 tablet 1   levonorgestrel (LILETTA, 52 MG,) 19.5 MCG/DAY IUD IUD 1 each by Intrauterine route once.     sacubitril -valsartan  (ENTRESTO ) 49-51 MG TAKE ONE TABLET BY MOUTH TWICE DAILY 60 tablet 8   No current facility-administered medications for this visit.   Allergies:  Hydrocodone   Social History: The patient  reports that she has never smoked. She has never used smokeless tobacco. She reports current alcohol use. She reports that she does not use drugs.   Family History: The patient's family history includes Diabetes in her paternal grandfather; High Cholesterol in her father, maternal grandfather, maternal grandmother, mother, and paternal grandmother; Hypertension in her father, maternal grandfather, maternal grandmother, mother, and paternal grandmother.   ROS:  Please see the history of present illness. Otherwise, complete review of systems is positive for none  All other systems are reviewed and negative.   Physical Exam: VS:  There were no vitals taken for this visit., BMI There is no height or  weight on file to calculate BMI.  Wt Readings from Last 3 Encounters:  01/19/23 290 lb (131.5 kg)  12/05/22 (!) 303 lb (137.4 kg)  01/17/22 (!) 311 lb 3.2 oz (141.2 kg)    General: Patient appears comfortable at rest. HEENT: Conjunctiva and lids normal, oropharynx clear with moist mucosa. Neck: Supple, no elevated JVP or carotid bruits, no thyromegaly. Lungs: Clear to auscultation, nonlabored breathing at rest. Cardiac: Regular rate and rhythm, no S3 or significant systolic murmur, no pericardial rub. Abdomen: Soft, nontender, no hepatomegaly, bowel sounds present, no guarding or rebound. Extremities: No pitting edema, distal pulses 2+. Skin: Warm and dry. Musculoskeletal: No kyphosis. Neuropsychiatric: Alert and oriented x3, affect grossly appropriate.  Recent Labwork: No results found for requested labs within last 365 days.  No results found for: CHOL, TRIG, HDL, CHOLHDL, VLDL, LDLCALC, LDLDIRECT  Assessment and Plan:  HTN, poorly controlled - Continue HCTZ 25 mg once daily - Continue amlodipine 10 mg once daily - Continue carvedilol  12.5 mg twice daily - Increase Entresto  from 49-51 mg to 97-103 mg twice daily - No evidence of RAS - Split-night sleep study pending (previous home sleep study was inconclusive) - Goal BP less than 130 mmHg SBP. - Encouraged weight loss.  Morbid obesity with BMI 50 - Encouraged weight loss.   I spent 30 minutes in reviewing prior medical records, records, more than the labs, discussion and documentation.     Medication Adjustments/Labs and Tests Ordered: Current medicines are reviewed at length with  the patient today.  Concerns regarding medicines are outlined above.    Disposition:  Follow up 6 months  Signed Frida Wahlstrom Priya Trino Higinbotham, MD, 03/13/2024 10:17 AM    [facility] at [facility] [address] [place], [place], [place] [address]  "

## 2024-03-27 ENCOUNTER — Ambulatory Visit (HOSPITAL_BASED_OUTPATIENT_CLINIC_OR_DEPARTMENT_OTHER): Admitting: Cardiology

## 2024-06-13 ENCOUNTER — Ambulatory Visit (HOSPITAL_BASED_OUTPATIENT_CLINIC_OR_DEPARTMENT_OTHER): Admitting: Cardiology
